# Patient Record
Sex: Female | Born: 1985 | Race: Black or African American | Hispanic: No | Marital: Single
Health system: Southern US, Community
[De-identification: ages and names within clinical notes are randomized; demographics above are authoritative.]

---
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
## 2007-10-21 ENCOUNTER — Emergency Department: Payer: Self-pay | Admitting: Emergency Medicine

## 2007-10-21 ENCOUNTER — Other Ambulatory Visit: Payer: Self-pay

## 2008-01-02 ENCOUNTER — Emergency Department: Payer: Self-pay | Admitting: Emergency Medicine

## 2009-03-28 IMAGING — CR DG CHEST 2V
1 series · 2 of 2 positions shown · non-contrast
Comparison: none

REASON FOR EXAM: cough, L sided pleuritic pain
COMMENTS:

[Series 1: view not recorded · 0.17mm/px · 2 of 2 slices shown]
[im 1/2]
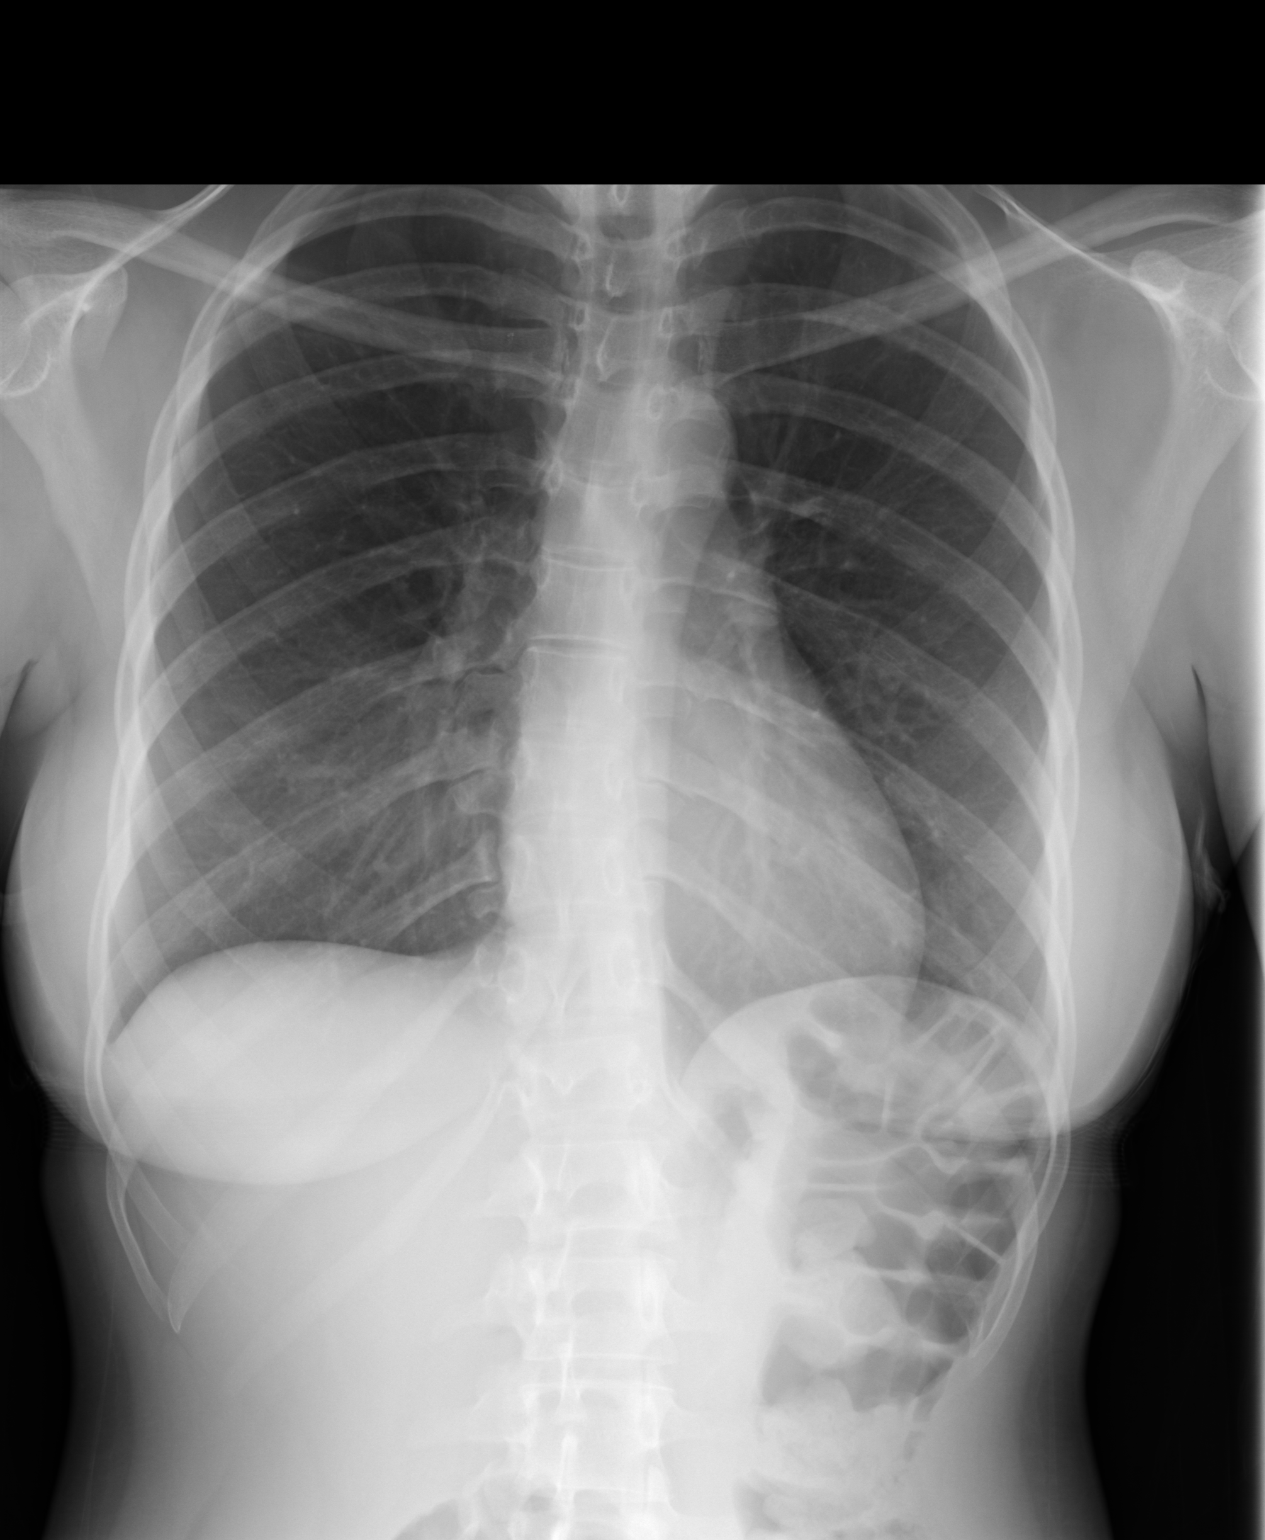
[im 2/2]
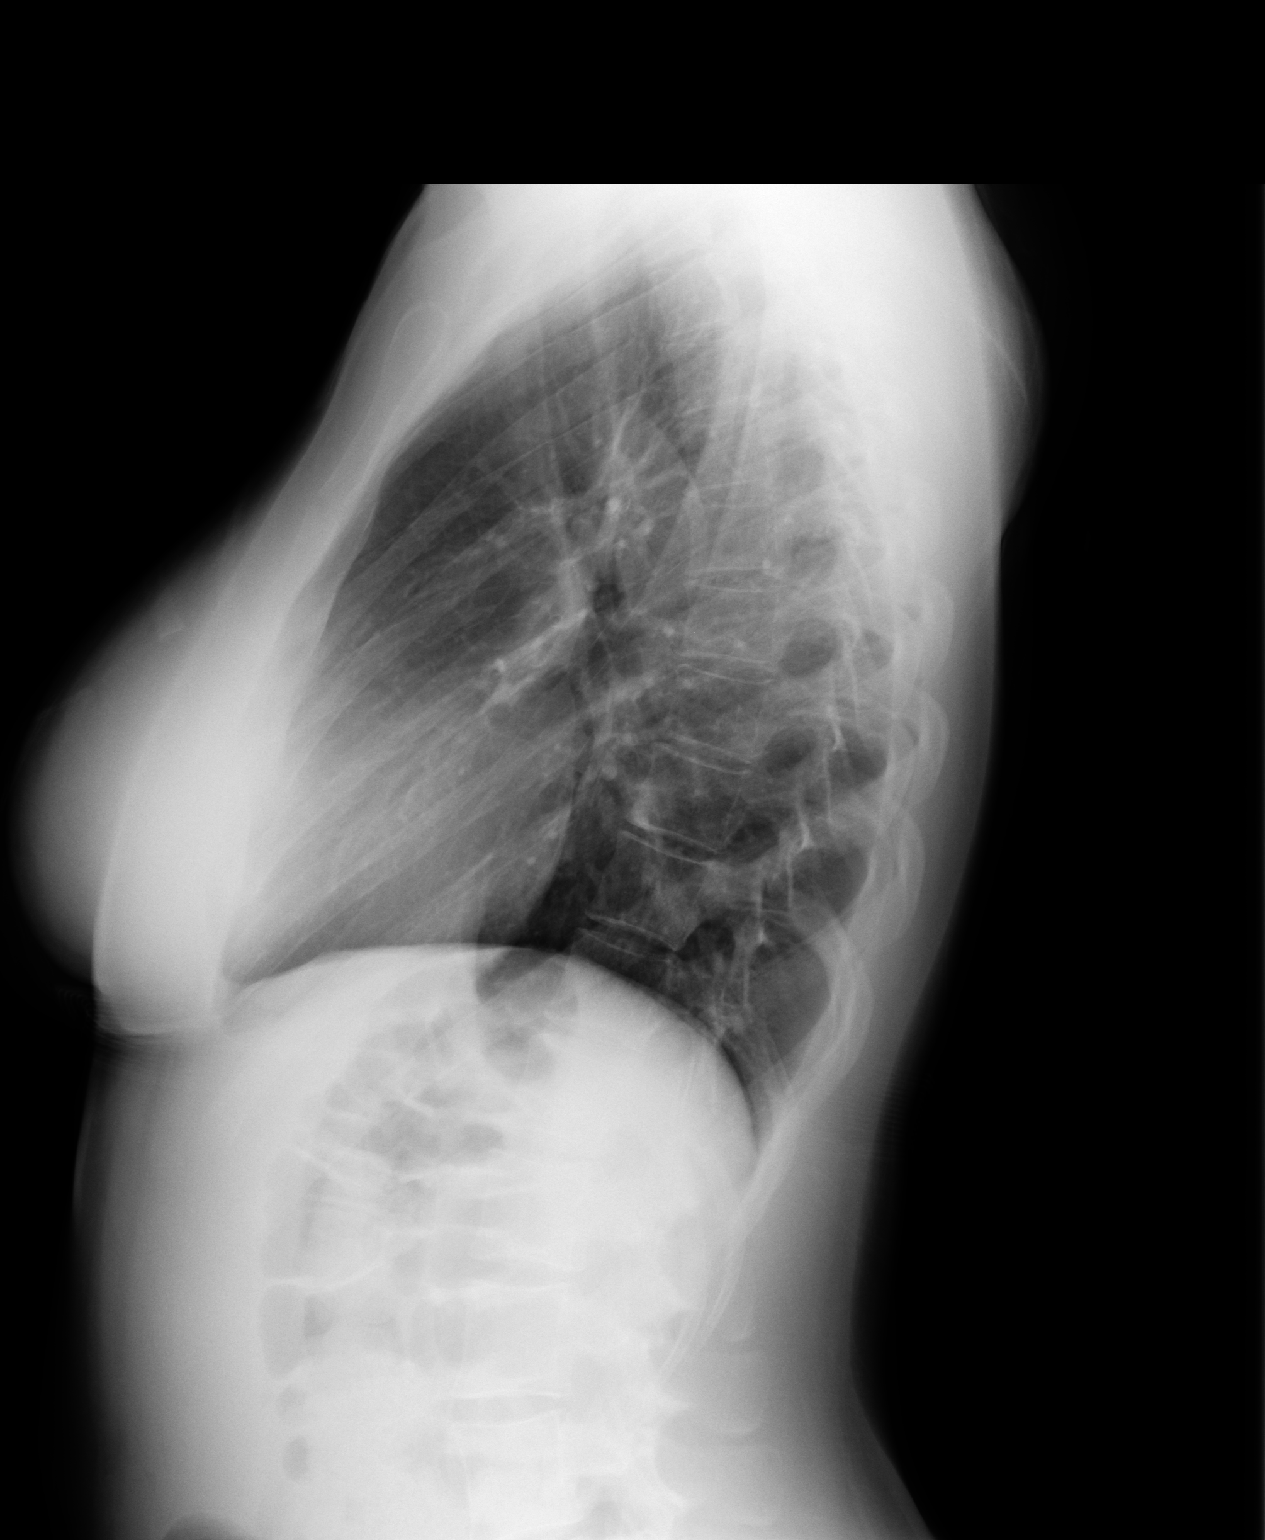

[2 of 2 positions shown; findings below may reference images not displayed]

PROCEDURE:     DXR - DXR CHEST PA (OR AP) AND LATERAL  - October 21, 2007  [DATE]

RESULT:     To prior exam for comparison.

The lungs are clear. The heart and pulmonary vessels are normal. The bony
and mediastinal structures are unremarkable. There is no effusion. There is
no pneumothorax or evidence of congestive failure.
IMPRESSION: No acute cardiopulmonary disease.

## 2009-06-10 IMAGING — CT CT ABD-PELV W/O CM
1 of 2 series · 15 of 32 positions shown, 19 images · non-contrast
Comparison: none

REASON FOR EXAM: RLQ pain
COMMENTS:

PROCEDURE:     CT  - CT ABDOMEN AND PELVIS W[DATE]  [DATE]
RESULT:     Emergent, noncontrast CT of the abdomen and pelvis is performed.
There is no previous exam available for comparison.

[Series 2: stone · axial · 0.62mm/px · z∈[-400,-49]mm · 15 of 131 slices shown, 19 images]
[im 9/131  soft-tissue]
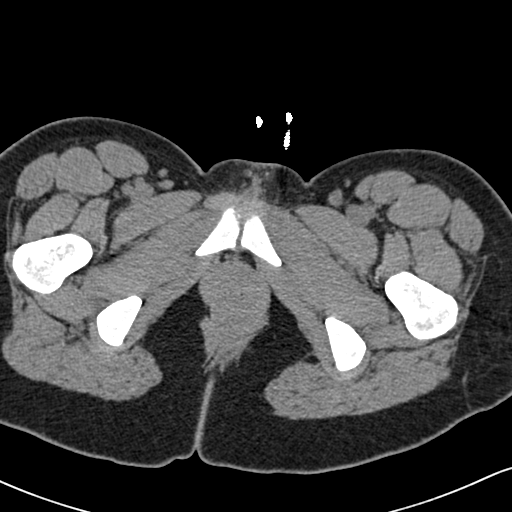
[im 9/131  bone]
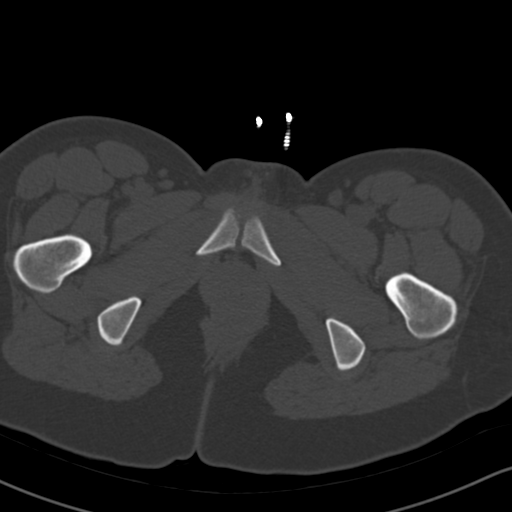
[im 18/131  soft-tissue]
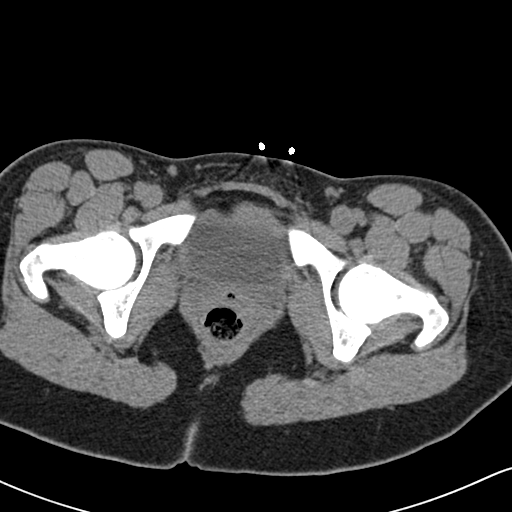
[im 27/131  soft-tissue]
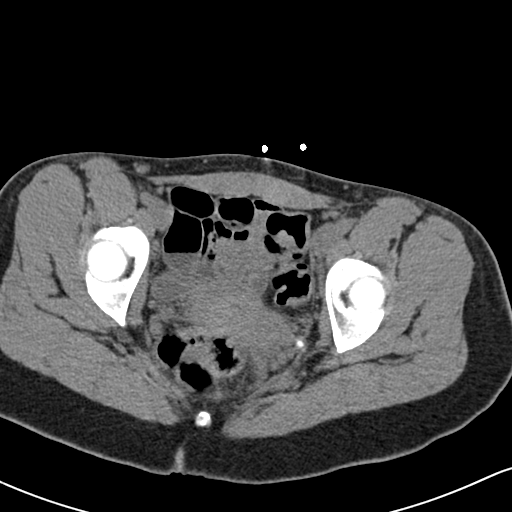
[im 36/131  soft-tissue]
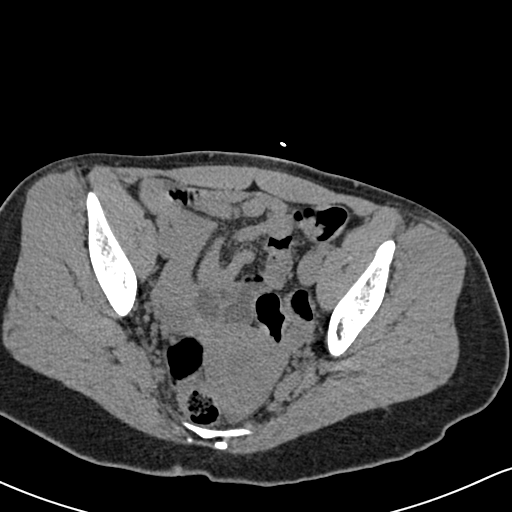
[im 45/131  soft-tissue]
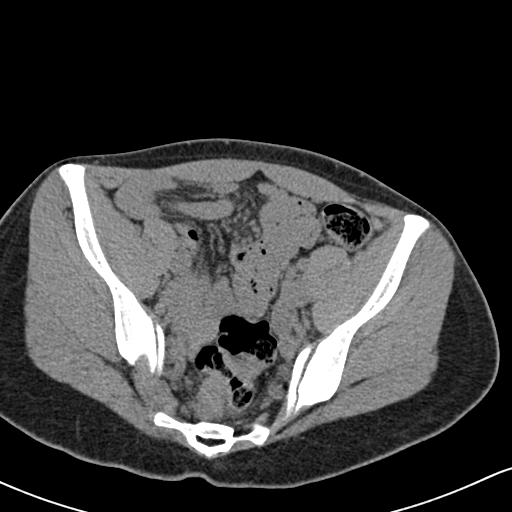
[im 54/131  soft-tissue]
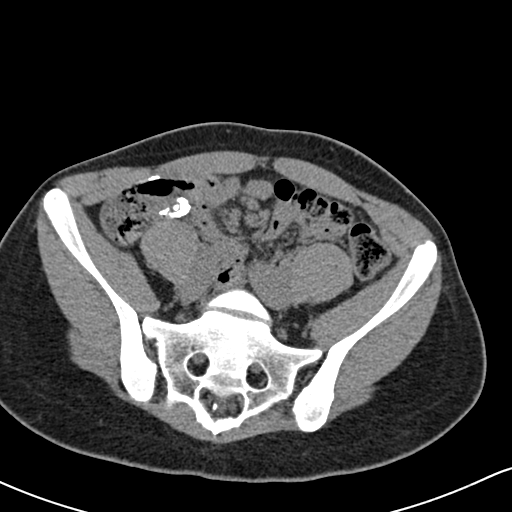
[im 68/131  soft-tissue]
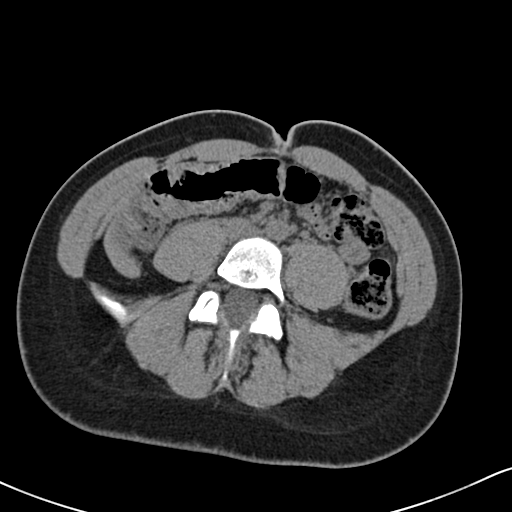
[im 77/131  soft-tissue]
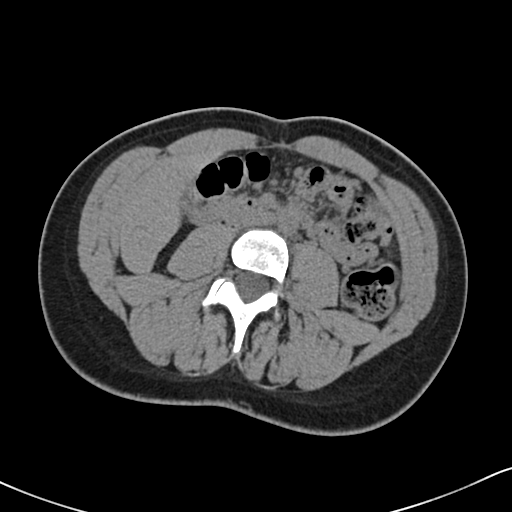
[im 86/131  soft-tissue]
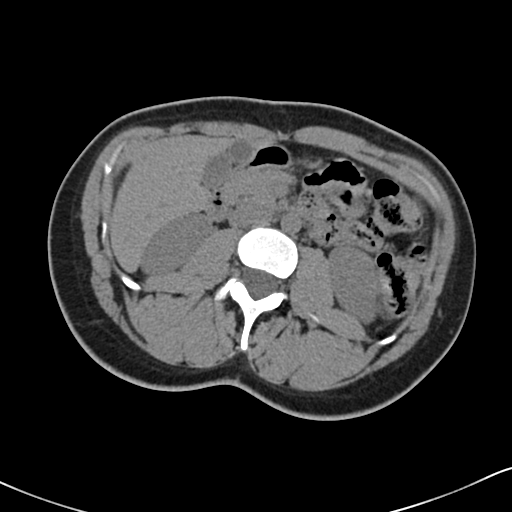
[im 86/131  bone]
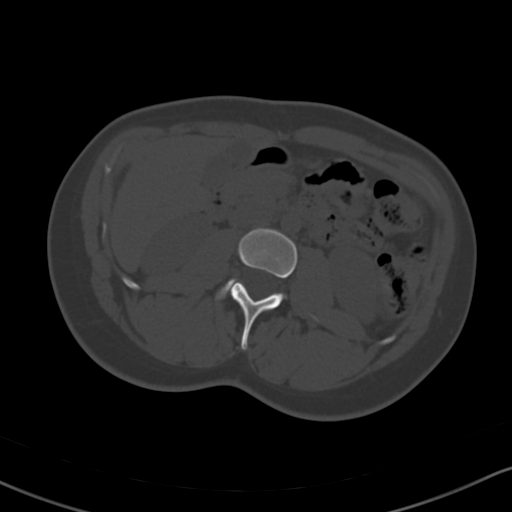
[im 95/131  soft-tissue]
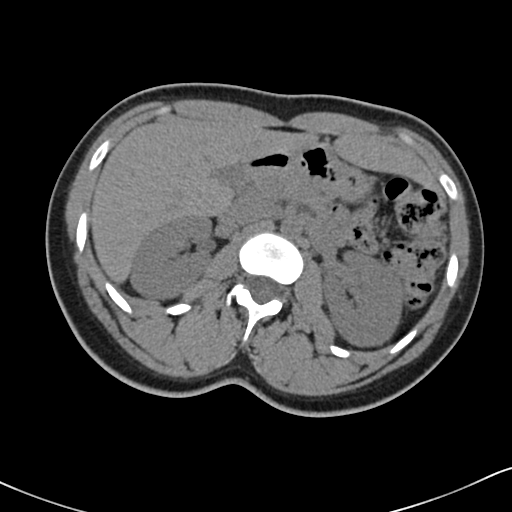
[im 104/131  soft-tissue]
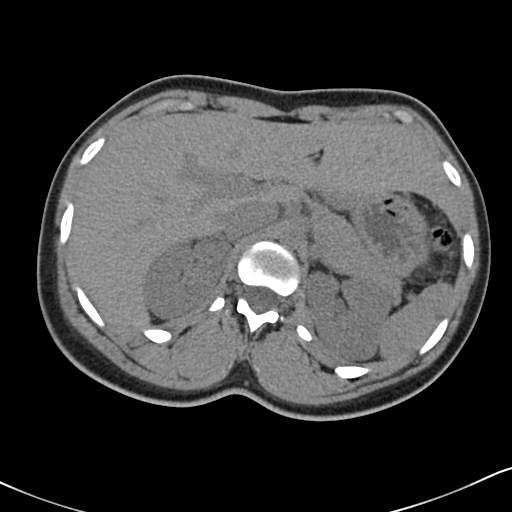
[im 113/131  soft-tissue]
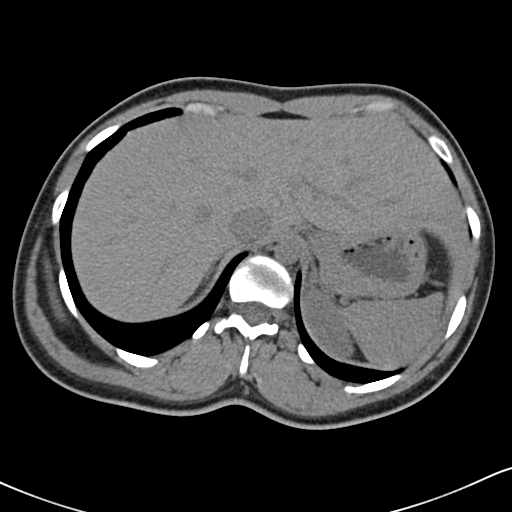
[im 113/131  lung]
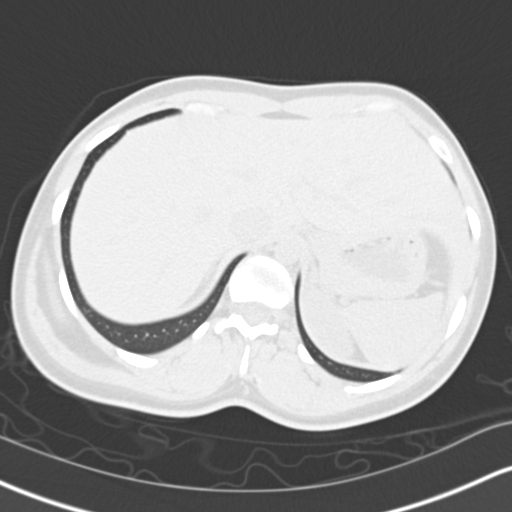
[im 117/131  lung]
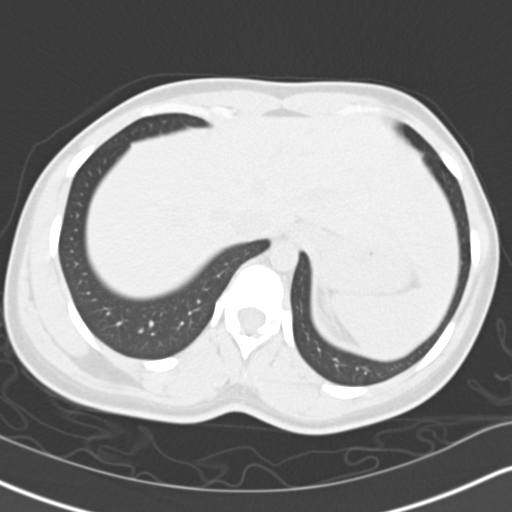
[im 122/131  soft-tissue]
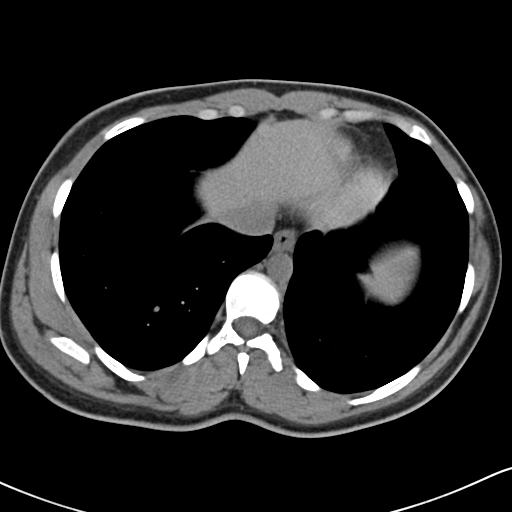
[im 122/131  lung]
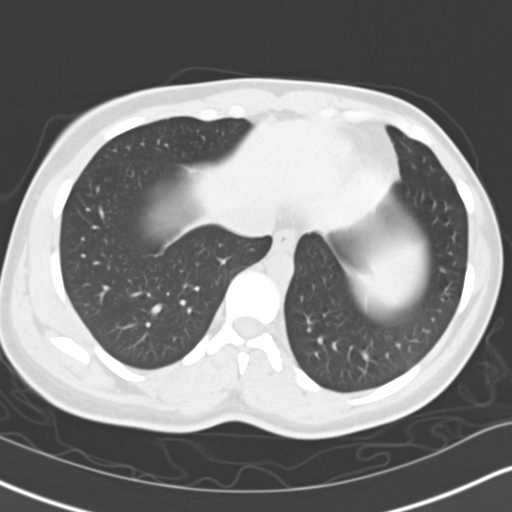
[im 126/131  lung]
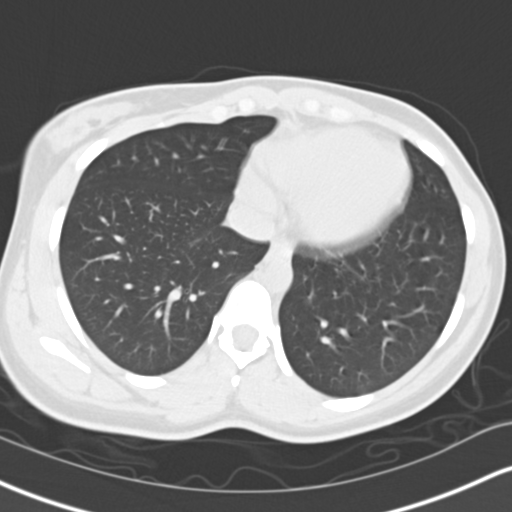

[15 of 32 positions shown; findings below may reference images not displayed]

FINDINGS: Images through the base of the lungs show no infiltrate, effusion
or pericardial effusion. No gallstones or renal calculi are evident. The
upper abdominal viscera appear to be unremarkable. The patient reports
previous bowel resection. There are surgical changes in the RIGHT lower
quadrant. There is no evidence of abnormal bowel distention. The appendix
cannot be identified. There is urine in the bladder. Calcific density is
present in the LEFT pelvis likely secondary to a phlebolith. No abnormal
fluid collection or definite free fluid is seen. The abdominal aorta is
normal in caliber.
IMPRESSION: 1.  No urinary tract stones or destructive changes.
2.  No evidence of bowel obstruction or perforation.
3.  No definite evidence of an acute inflammatory process. If that is a
concern, follow-up with IV and oral contrast is strongly recommended.

## 2014-06-18 ENCOUNTER — Telehealth: Payer: Self-pay | Admitting: Oncology

## 2014-06-18 NOTE — Telephone Encounter (Signed)
LEFT MESSAGE FOR PATIENT TO RETURN CALL TO SCHEDULE NP APPT.  °

## 2014-11-25 DEATH — deceased
